# Patient Record
Sex: Female | Born: 1970 | Race: White | Hispanic: No | Marital: Married | Smoking: Never smoker
Health system: Southern US, Community
[De-identification: ages and names within clinical notes are randomized; demographics above are authoritative.]

## PROBLEM LIST (undated history)

## (undated) HISTORY — PX: TUBAL LIGATION: SHX77

---

## 1997-06-29 ENCOUNTER — Inpatient Hospital Stay (HOSPITAL_COMMUNITY): Admission: AD | Admit: 1997-06-29 | Discharge: 1997-07-01 | Payer: Self-pay | Admitting: Obstetrics and Gynecology

## 1997-08-08 ENCOUNTER — Other Ambulatory Visit: Admission: RE | Admit: 1997-08-08 | Discharge: 1997-08-08 | Payer: Self-pay | Admitting: Obstetrics and Gynecology

## 1998-09-04 ENCOUNTER — Other Ambulatory Visit: Admission: RE | Admit: 1998-09-04 | Discharge: 1998-09-04 | Payer: Self-pay | Admitting: Obstetrics and Gynecology

## 1999-04-02 ENCOUNTER — Inpatient Hospital Stay (HOSPITAL_COMMUNITY): Admission: AD | Admit: 1999-04-02 | Discharge: 1999-04-05 | Payer: Self-pay | Admitting: Obstetrics and Gynecology

## 1999-05-11 ENCOUNTER — Other Ambulatory Visit: Admission: RE | Admit: 1999-05-11 | Discharge: 1999-05-11 | Payer: Self-pay | Admitting: Obstetrics and Gynecology

## 2000-05-19 ENCOUNTER — Other Ambulatory Visit: Admission: RE | Admit: 2000-05-19 | Discharge: 2000-05-19 | Payer: Self-pay | Admitting: Obstetrics and Gynecology

## 2001-03-26 ENCOUNTER — Other Ambulatory Visit: Admission: RE | Admit: 2001-03-26 | Discharge: 2001-03-26 | Payer: Self-pay | Admitting: Obstetrics and Gynecology

## 2001-10-11 ENCOUNTER — Encounter: Payer: Self-pay | Admitting: Obstetrics and Gynecology

## 2001-10-11 ENCOUNTER — Inpatient Hospital Stay (HOSPITAL_COMMUNITY): Admission: AD | Admit: 2001-10-11 | Discharge: 2001-10-13 | Payer: Self-pay | Admitting: Obstetrics and Gynecology

## 2001-11-26 ENCOUNTER — Other Ambulatory Visit: Admission: RE | Admit: 2001-11-26 | Discharge: 2001-11-26 | Payer: Self-pay | Admitting: Obstetrics and Gynecology

## 2002-01-29 ENCOUNTER — Encounter: Admission: RE | Admit: 2002-01-29 | Discharge: 2002-01-29 | Payer: Self-pay | Admitting: Pediatrics

## 2002-12-23 ENCOUNTER — Other Ambulatory Visit: Admission: RE | Admit: 2002-12-23 | Discharge: 2002-12-23 | Payer: Self-pay | Admitting: Obstetrics and Gynecology

## 2003-11-27 ENCOUNTER — Other Ambulatory Visit: Admission: RE | Admit: 2003-11-27 | Discharge: 2003-11-27 | Payer: Self-pay | Admitting: Obstetrics and Gynecology

## 2004-06-16 ENCOUNTER — Inpatient Hospital Stay (HOSPITAL_COMMUNITY): Admission: RE | Admit: 2004-06-16 | Discharge: 2004-06-19 | Payer: Self-pay | Admitting: Obstetrics and Gynecology

## 2004-07-28 ENCOUNTER — Other Ambulatory Visit: Admission: RE | Admit: 2004-07-28 | Discharge: 2004-07-28 | Payer: Self-pay | Admitting: Obstetrics and Gynecology

## 2006-02-27 ENCOUNTER — Inpatient Hospital Stay (HOSPITAL_COMMUNITY): Admission: AD | Admit: 2006-02-27 | Discharge: 2006-03-02 | Payer: Self-pay | Admitting: Obstetrics and Gynecology

## 2011-05-09 ENCOUNTER — Other Ambulatory Visit: Payer: Self-pay | Admitting: Otolaryngology

## 2011-05-09 DIAGNOSIS — R599 Enlarged lymph nodes, unspecified: Secondary | ICD-10-CM

## 2011-05-12 ENCOUNTER — Ambulatory Visit
Admission: RE | Admit: 2011-05-12 | Discharge: 2011-05-12 | Disposition: A | Discharge status: Home / Self Care | Payer: 59 | Source: Ambulatory Visit | Attending: Otolaryngology | Admitting: Otolaryngology

## 2011-05-12 DIAGNOSIS — R599 Enlarged lymph nodes, unspecified: Secondary | ICD-10-CM

## 2011-05-12 MED ORDER — IOHEXOL 300 MG/ML  SOLN
75.0000 mL | Freq: Once | INTRAMUSCULAR | Status: AC | PRN
  Administered 2011-05-12: 75 mL via INTRAVENOUS

## 2011-08-20 HISTORY — PX: OTHER SURGICAL HISTORY: SHX169

## 2011-09-06 ENCOUNTER — Other Ambulatory Visit: Payer: Self-pay | Admitting: Otolaryngology

## 2015-08-04 ENCOUNTER — Other Ambulatory Visit: Payer: Self-pay | Admitting: Obstetrics and Gynecology

## 2015-08-04 DIAGNOSIS — R928 Other abnormal and inconclusive findings on diagnostic imaging of breast: Secondary | ICD-10-CM

## 2015-08-10 ENCOUNTER — Ambulatory Visit
Admission: RE | Admit: 2015-08-10 | Discharge: 2015-08-10 | Disposition: A | Discharge status: Home / Self Care | Payer: BLUE CROSS/BLUE SHIELD | Source: Ambulatory Visit | Attending: Obstetrics and Gynecology | Admitting: Obstetrics and Gynecology

## 2015-08-10 DIAGNOSIS — R928 Other abnormal and inconclusive findings on diagnostic imaging of breast: Secondary | ICD-10-CM

## 2018-11-07 DIAGNOSIS — R51 Headache: Secondary | ICD-10-CM | POA: Diagnosis not present

## 2018-11-07 DIAGNOSIS — M546 Pain in thoracic spine: Secondary | ICD-10-CM | POA: Diagnosis not present

## 2018-11-07 DIAGNOSIS — M5013 Cervical disc disorder with radiculopathy, cervicothoracic region: Secondary | ICD-10-CM | POA: Diagnosis not present

## 2018-11-07 DIAGNOSIS — M62830 Muscle spasm of back: Secondary | ICD-10-CM | POA: Diagnosis not present

## 2018-12-05 DIAGNOSIS — M5013 Cervical disc disorder with radiculopathy, cervicothoracic region: Secondary | ICD-10-CM | POA: Diagnosis not present

## 2018-12-05 DIAGNOSIS — M62830 Muscle spasm of back: Secondary | ICD-10-CM | POA: Diagnosis not present

## 2018-12-05 DIAGNOSIS — R51 Headache: Secondary | ICD-10-CM | POA: Diagnosis not present

## 2018-12-05 DIAGNOSIS — M546 Pain in thoracic spine: Secondary | ICD-10-CM | POA: Diagnosis not present

## 2019-01-02 DIAGNOSIS — M5013 Cervical disc disorder with radiculopathy, cervicothoracic region: Secondary | ICD-10-CM | POA: Diagnosis not present

## 2019-01-02 DIAGNOSIS — M62830 Muscle spasm of back: Secondary | ICD-10-CM | POA: Diagnosis not present

## 2019-01-02 DIAGNOSIS — M546 Pain in thoracic spine: Secondary | ICD-10-CM | POA: Diagnosis not present

## 2019-01-30 DIAGNOSIS — M62830 Muscle spasm of back: Secondary | ICD-10-CM | POA: Diagnosis not present

## 2019-01-30 DIAGNOSIS — M546 Pain in thoracic spine: Secondary | ICD-10-CM | POA: Diagnosis not present

## 2019-01-30 DIAGNOSIS — M5013 Cervical disc disorder with radiculopathy, cervicothoracic region: Secondary | ICD-10-CM | POA: Diagnosis not present

## 2019-02-27 DIAGNOSIS — M62830 Muscle spasm of back: Secondary | ICD-10-CM | POA: Diagnosis not present

## 2019-02-27 DIAGNOSIS — M5013 Cervical disc disorder with radiculopathy, cervicothoracic region: Secondary | ICD-10-CM | POA: Diagnosis not present

## 2019-02-27 DIAGNOSIS — M546 Pain in thoracic spine: Secondary | ICD-10-CM | POA: Diagnosis not present

## 2019-03-27 DIAGNOSIS — M5013 Cervical disc disorder with radiculopathy, cervicothoracic region: Secondary | ICD-10-CM | POA: Diagnosis not present

## 2019-03-27 DIAGNOSIS — M546 Pain in thoracic spine: Secondary | ICD-10-CM | POA: Diagnosis not present

## 2019-03-27 DIAGNOSIS — M62830 Muscle spasm of back: Secondary | ICD-10-CM | POA: Diagnosis not present

## 2019-04-24 DIAGNOSIS — M62830 Muscle spasm of back: Secondary | ICD-10-CM | POA: Diagnosis not present

## 2019-04-24 DIAGNOSIS — M546 Pain in thoracic spine: Secondary | ICD-10-CM | POA: Diagnosis not present

## 2019-04-24 DIAGNOSIS — M5013 Cervical disc disorder with radiculopathy, cervicothoracic region: Secondary | ICD-10-CM | POA: Diagnosis not present

## 2019-05-22 DIAGNOSIS — M62830 Muscle spasm of back: Secondary | ICD-10-CM | POA: Diagnosis not present

## 2019-05-22 DIAGNOSIS — M5013 Cervical disc disorder with radiculopathy, cervicothoracic region: Secondary | ICD-10-CM | POA: Diagnosis not present

## 2019-05-22 DIAGNOSIS — M546 Pain in thoracic spine: Secondary | ICD-10-CM | POA: Diagnosis not present

## 2019-06-26 DIAGNOSIS — M5013 Cervical disc disorder with radiculopathy, cervicothoracic region: Secondary | ICD-10-CM | POA: Diagnosis not present

## 2019-06-26 DIAGNOSIS — M62830 Muscle spasm of back: Secondary | ICD-10-CM | POA: Diagnosis not present

## 2019-06-26 DIAGNOSIS — M546 Pain in thoracic spine: Secondary | ICD-10-CM | POA: Diagnosis not present

## 2019-08-01 DIAGNOSIS — M546 Pain in thoracic spine: Secondary | ICD-10-CM | POA: Diagnosis not present

## 2019-08-01 DIAGNOSIS — M5013 Cervical disc disorder with radiculopathy, cervicothoracic region: Secondary | ICD-10-CM | POA: Diagnosis not present

## 2019-08-01 DIAGNOSIS — M62830 Muscle spasm of back: Secondary | ICD-10-CM | POA: Diagnosis not present

## 2019-08-28 DIAGNOSIS — M546 Pain in thoracic spine: Secondary | ICD-10-CM | POA: Diagnosis not present

## 2019-08-28 DIAGNOSIS — M5013 Cervical disc disorder with radiculopathy, cervicothoracic region: Secondary | ICD-10-CM | POA: Diagnosis not present

## 2019-08-28 DIAGNOSIS — M62830 Muscle spasm of back: Secondary | ICD-10-CM | POA: Diagnosis not present

## 2019-11-06 DIAGNOSIS — Z20828 Contact with and (suspected) exposure to other viral communicable diseases: Secondary | ICD-10-CM | POA: Diagnosis not present

## 2019-11-06 DIAGNOSIS — R509 Fever, unspecified: Secondary | ICD-10-CM | POA: Diagnosis not present

## 2019-11-10 DIAGNOSIS — E860 Dehydration: Secondary | ICD-10-CM | POA: Diagnosis not present

## 2019-11-10 DIAGNOSIS — R110 Nausea: Secondary | ICD-10-CM | POA: Diagnosis not present

## 2019-11-10 DIAGNOSIS — U071 COVID-19: Secondary | ICD-10-CM | POA: Diagnosis not present

## 2019-11-14 DIAGNOSIS — R110 Nausea: Secondary | ICD-10-CM | POA: Diagnosis not present

## 2019-11-14 DIAGNOSIS — Z20828 Contact with and (suspected) exposure to other viral communicable diseases: Secondary | ICD-10-CM | POA: Diagnosis not present

## 2019-11-14 DIAGNOSIS — R519 Headache, unspecified: Secondary | ICD-10-CM | POA: Diagnosis not present

## 2019-11-14 DIAGNOSIS — R5383 Other fatigue: Secondary | ICD-10-CM | POA: Diagnosis not present

## 2020-01-14 DIAGNOSIS — H40003 Preglaucoma, unspecified, bilateral: Secondary | ICD-10-CM | POA: Diagnosis not present

## 2020-01-23 ENCOUNTER — Ambulatory Visit: Payer: BLUE CROSS/BLUE SHIELD | Admitting: Family Medicine

## 2020-04-08 ENCOUNTER — Ambulatory Visit: Payer: BLUE CROSS/BLUE SHIELD | Admitting: Family Medicine

## 2020-05-25 NOTE — Progress Notes (Signed)
New Patient Office Visit  Subjective:  Patient ID: Suzanne Costa, female    DOB: 1970/12/05  Age: 50 y.o. MRN: [id number]  CC:  Chief Complaint  Patient presents with  . Gynecologic Exam    HPI [name] presents for History reviewed. No pertinent past medical history.  Encounter for general adult medical examination without abnormal findings  Physical ("At Risk" items are starred): Patient's last physical exam was > 5 years ago .  Smoking: Life-long non-smoker ;  Physical Activity: Exercises at least 3 times per week ;  Alcohol/Drug Use: Is a non-drinker ; No illicit drug use ;  Patient is not afflicted from Stress Incontinence and Urge Incontinence  Safety: reviewed ; Patient wears a seat belt, has smoke detectors, and wears sunscreen with extended sun exposure. No gas in home so no need for carbon monoxide detectors and does not have guns in home.  Dental Care: biannual cleanings, brushes and flosses daily. Ophthalmology/Optometry: Annual visit.  Hearing loss: none Vision impairments: glasses.  Past Surgical History:  Procedure Laterality Date  . CESAREAN SECTION     x 3  . 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 TUBAL LIGATION     with lost c/s    Family History  Problem Relation Age of Onset  . Hyperlipidemia Mother   . Heart attack Father   . CVA Father   . Asthma Father     Social History   Socioeconomic History  . Marital status: Married    Spouse name: Not on file  . Number of children: 4  . Years of education: Not on file  . Highest education level: Not on file  Occupational History  . Not on file  Tobacco Use  . Smoking status: Never Smoker  . Smokeless tobacco: Never Used  Substance and Sexual Activity  . Alcohol use: Never  . Drug use: Never  . Sexual activity: Not on file  Other Topics Concern  . Not on file  Social History Narrative  . Not on file   Social Determinants of Health   Financial Resource Strain: Not on file   Food Insecurity: Not on file  Transportation Needs: Not on file  Physical Activity: Not on file  Stress: Not on file  Social Connections: Not on file  Intimate Partner Violence: Not on file    ROS Review of Systems  Constitutional: Negative for chills, fatigue and fever.  HENT: Negative for congestion, ear pain, rhinorrhea and sore throat.   Respiratory: Negative for cough and shortness of breath.   Cardiovascular: Negative for chest pain.  Gastrointestinal: Negative for abdominal pain, constipation, diarrhea, nausea and vomiting.  Genitourinary: Negative for dysuria and urgency.  Musculoskeletal: Positive for arthralgias. Negative for back pain and myalgias.  Neurological: Negative for dizziness, weakness, light-headedness and headaches.  Psychiatric/Behavioral: Negative for dysphoric mood. The patient is not nervous/anxious.     Objective:   Today's Vitals: BP 118/68   Pulse 84   Temp 98.1 F (36.7 C)   Resp 16   Ht 5\' 3"  (1.6 m)   Wt 175 lb 3.2 oz (79.5 kg)   BMI 31.04 kg/m   Physical Exam Vitals reviewed. Exam conducted with a chaperone present.  Constitutional:      Appearance: Normal appearance. She is normal weight.  HENT:     Right Ear: Tympanic membrane normal.     Left Ear: Tympanic membrane normal.     Nose: Nose normal.  Mouth/Throat:     Pharynx: No oropharyngeal exudate or posterior oropharyngeal erythema.  Eyes:     Conjunctiva/sclera: Conjunctivae normal.  Neck:     Vascular: No carotid bruit.     Comments: No thyromegaly. Cardiovascular:     Rate and Rhythm: Normal rate and regular rhythm.     Pulses: Normal pulses.     Heart sounds: Normal heart sounds.  Pulmonary:     Effort: Pulmonary effort is normal. No respiratory distress.     Breath sounds: Normal breath sounds.  Chest:  Breasts:     Right: Normal. No axillary adenopathy.     Left: Normal. No axillary adenopathy.    Abdominal:     General: Abdomen is flat. Bowel sounds are  normal.     Palpations: Abdomen is soft.     Tenderness: There is no abdominal tenderness.  Genitourinary:    General: Normal vulva.     Labia:        Right: No lesion.        Left: No lesion.      Vagina: No vaginal discharge.     Comments: Interval exam: no uterine enlargement. No ovaries palpated.  Lymphadenopathy:     Cervical: No cervical adenopathy.     Upper Body:     Right upper body: No axillary adenopathy.     Left upper body: No axillary adenopathy.  Skin:    Findings: No lesion or rash.  Neurological:     Mental Status: She is alert and oriented to person, place, and time.  Psychiatric:        Mood and Affect: Mood normal.        Behavior: Behavior normal.     Assessment & Plan:  1. General medical exam Healthy female.  Recommend continue to work on eating healthy diet and exercise. Refuses vaccinations.   2. Visit for screening mammogram - MM Digital Screening  3. Cervical cancer screening - IGP, Aptima HPV, rfx 16/18,45  4. Screen for colon cancer Discussed colon cancer screening. Pt to consider cologuard versus colonoscopy and let us know.    Follow-up: Return for fasting labs.Blane Ohara, MD

## 2020-05-26 ENCOUNTER — Other Ambulatory Visit: Payer: Self-pay

## 2020-05-26 ENCOUNTER — Encounter: Payer: Self-pay | Admitting: Family Medicine

## 2020-05-26 ENCOUNTER — Ambulatory Visit: Payer: BC Managed Care – PPO | Admitting: Family Medicine

## 2020-05-26 VITALS — BP 118/68 | HR 84 | Temp 98.1°F | Resp 16 | Ht 63.0 in | Wt 175.2 lb

## 2020-05-26 DIAGNOSIS — Z0000 Encounter for general adult medical examination without abnormal findings: Secondary | ICD-10-CM

## 2020-05-26 DIAGNOSIS — Z1211 Encounter for screening for malignant neoplasm of colon: Secondary | ICD-10-CM

## 2020-05-26 DIAGNOSIS — Z1231 Encounter for screening mammogram for malignant neoplasm of breast: Secondary | ICD-10-CM

## 2020-05-26 DIAGNOSIS — Z124 Encounter for screening for malignant neoplasm of cervix: Secondary | ICD-10-CM | POA: Diagnosis not present

## 2020-05-26 NOTE — Patient Instructions (Signed)
Preventive Care 84-51 Years Old, Female Preventive care refers to lifestyle choices and visits with your health care provider that can promote health and wellness. This includes:  A yearly physical exam. This is also called an annual wellness visit.  Regular dental and eye exams.  Immunizations.  Screening for certain conditions.  Healthy lifestyle choices, such as: ? Eating a healthy diet. ? Getting regular exercise. ? Not using drugs or products that contain nicotine and tobacco. ? Limiting alcohol use. What can I expect for my preventive care visit? Physical exam Your health care provider will check your:  Height and weight. These may be used to calculate your BMI (body mass index). BMI is a measurement that tells if you are at a healthy weight.  Heart rate and blood pressure.  Body temperature.  Skin for abnormal spots. Counseling Your health care provider may ask you questions about your:  Past medical problems.  Family's medical history.  Alcohol, tobacco, and drug use.  Emotional well-being.  Home life and relationship well-being.  Sexual activity.  Diet, exercise, and sleep habits.  Work and work Statistician.  Access to firearms.  Method of birth control.  Menstrual cycle.  Pregnancy history. What immunizations do I need? Vaccines are usually given at various ages, according to a schedule. Your health care provider will recommend vaccines for you based on your age, medical history, and lifestyle or other factors, such as travel or where you work.   What tests do I need? Blood tests  Lipid and cholesterol levels. These may be checked every 5 years, or more often if you are over 51 years old.  Hepatitis C test.  Hepatitis B test. Screening  Lung cancer screening. You may have this screening every year starting at age 51 if you have a 30-pack-year history of smoking and currently smoke or have quit within the past 15 years.  Colorectal cancer  screening. ? All adults should have this screening starting at age 51 and continuing until age 17. ? Your health care provider may recommend screening at age 49 if you are at increased risk. ? You will have tests every 1-10 years, depending on your results and the type of screening test.  Diabetes screening. ? This is done by checking your blood sugar (glucose) after you have not eaten for a while (fasting). ? You may have this done every 1-3 years.  Mammogram. ? This may be done every 1-2 years. ? Talk with your health care provider about when you should start having regular mammograms. This may depend on whether you have a family history of breast cancer.  BRCA-related cancer screening. This may be done if you have a family history of breast, ovarian, tubal, or peritoneal cancers.  Pelvic exam and Pap test. ? This may be done every 3 years starting at age 51. ? Starting at age 51, this may be done every 5 years if you have a Pap test in combination with an HPV test. Other tests  STD (sexually transmitted disease) testing, if you are at risk.  Bone density scan. This is done to screen for osteoporosis. You may have this scan if you are at high risk for osteoporosis. Talk with your health care provider about your test results, treatment options, and if necessary, the need for more tests. Follow these instructions at home: Eating and drinking  Eat a diet that includes fresh fruits and vegetables, whole grains, lean protein, and low-fat dairy products.  Take vitamin and mineral supplements  as recommended by your health care provider.  Do not drink alcohol if: ? Your health care provider tells you not to drink. ? You are pregnant, may be pregnant, or are planning to become pregnant.  If you drink alcohol: ? Limit how much you have to 0-1 drink a day. ? Be aware of how much alcohol is in your drink. In the [place] one drink equals one 12 oz bottle of beer (355 mL), one 5 oz glass of  wine (148 mL), or one 1 oz glass of hard liquor (44 mL).   Lifestyle  Take daily care of your teeth and gums. Brush your teeth every morning and night with fluoride toothpaste. Floss one time each day.  Stay active. Exercise for at least 30 minutes 5 or more days each week.  Do not use any products that contain nicotine or tobacco, such as cigarettes, e-cigarettes, and chewing tobacco. If you need help quitting, ask your health care provider.  Do not use drugs.  If you are sexually active, practice safe sex. Use a condom or other form of protection to prevent STIs (sexually transmitted infections).  If you do not wish to become pregnant, use a form of birth control. If you plan to become pregnant, see your health care provider for a prepregnancy visit.  If told by your health care provider, take low-dose aspirin daily starting at age 51.  Find healthy ways to cope with stress, such as: ? Meditation, yoga, or listening to music. ? Journaling. ? Talking to a trusted person. ? Spending time with friends and family. Safety  Always wear your seat belt while driving or riding in a vehicle.  Do not drive: ? If you have been drinking alcohol. Do not ride with someone who has been drinking. ? When you are tired or distracted. ? While texting.  Wear a helmet and other protective equipment during sports activities.  If you have firearms in your house, make sure you follow all gun safety procedures. What's next?  Visit your health care provider once a year for an annual wellness visit.  Ask your health care provider how often you should have your eyes and teeth checked.  Stay up to date on all vaccines. This information is not intended to replace advice given to you by your health care provider. Make sure you discuss any questions you have with your health care provider. Document Revised: 12/10/2019 Document Reviewed: 11/16/2017 Elsevier Patient Education  2021 [employer].    https://www.cancer.org/cancer/colon-rectal-cancer/causes-risks-prevention/risk-factors.html">   Colorectal Cancer Screening  Colorectal cancer screening is a group of tests that are used to check for colorectal cancer before symptoms develop. Colorectal refers to the colon and rectum. The colon and rectum are located at the end of the digestive tract and carry stool (feces) out of the body. Who should have screening? All adults who are 65-103 years old should have screening. Your health care provider may recommend screening before age 40. You will have tests every 1-10 years, depending on your results and the type of screening test. Screening recommendations for adults who are 47-102 years old vary depending on a person's health. People older than age 59 should no longer get colorectal cancer screening. You may have screening tests starting before age 63, or more often than other people, if you have any of these risk factors:  A personal or family history of colorectal cancer or abnormal growths known as polyps in your colon.  Inflammatory bowel disease, such as ulcerative colitis  or Crohn's disease.  A history of having radiation treatment to the abdomen or the area between the hip bones (pelvic area) for cancer.  A type of genetic syndrome that is passed from parent to child (hereditary), such as: ? Lynch syndrome. ? Familial adenomatous polyposis. ? Turcot syndrome. ? Peutz-Jeghers syndrome. ? MUTYH-associated polyposis (MAP).  A personal history of diabetes. Types of tests There are several types of colorectal screening tests. You may have one or more of the following:  Guaiac-based fecal occult blood testing. For this test, a stool sample is checked for hidden (occult) blood, which could be a sign of colorectal cancer.  Fecal immunochemical test (FIT). For this test, a stool sample is checked for blood, which could be a sign of colorectal cancer.  Stool DNA test. For this  test, a stool sample is checked for blood and changes in DNA that could lead to colorectal cancer.  Sigmoidoscopy. During this test, a thin, flexible tube with a camera on the end, called a sigmoidoscope, is used to examine the rectum and the lower colon.  Colonoscopy. During this test, a long, flexible tube with a camera on the end, called a colonoscope, is used to examine the entire colon and rectum. Also, sometimes a tissue sample is taken to be looked at under a microscope (biopsy) or small polyps are removed during this test.  Virtual colonoscopy. Instead of a colonoscope, this type of colonoscopy uses a CT scan to take pictures of the colon and rectum. A CT scan is a type of X-ray that is made using computers. What are the benefits of screening? Screening reduces your risk for colorectal cancer and can help identify cancer at an early stage, when the cancer can be removed or treated more easily. It is common for polyps to form in the lining of the colon, especially as you age. These polyps may be cancerous or become cancerous over time. Screening can identify these polyps. What are the risks of screening? Generally, these are safe tests. However, problems may occur, including:  The need for more tests to confirm results from a stool sample test. Stool sample tests have fewer risks than other types of screening tests.  Being exposed to low levels of radiation, if you had a test involving X-rays. This may slightly increase your cancer risk. The benefit of detecting cancer outweighs the slight increase in risk.  Bleeding, damage to the intestine, or infection caused by a sigmoidoscopy or colonoscopy.  A reaction to medicines given during a sigmoidoscopy or colonoscopy. Talk with your health care provider to understand your risk for colorectal cancer and to make a screening plan that is right for you. Questions to ask your health care provider  When should I start colorectal cancer  screening?  What is my risk for colorectal cancer?  How often do I need screening?  Which screening tests do I need?  How do I get my test results?  What do my results mean? Where to find more information Learn more about colorectal cancer screening from:  The American Cancer Society: cancer.[place]ald: cancer.gov Summary  Colorectal cancer screening is a group of tests used to check for colorectal cancer before symptoms develop.  All adults who are 61-1 years old should have screening. Your health care provider may recommend screening before age 36.  You may have screening tests starting before age 34, or more often than other people, if you have certain risk factors.  Screening reduces your  risk for colorectal cancer and can help identify cancer at an early stage, when the cancer can be removed or treated more easily.  Talk with your health care provider to understand your risk for colorectal cancer and to make a screening plan that is right for you. This information is not intended to replace advice given to you by your health care provider. Make sure you discuss any questions you have with your health care provider. Document Revised: 06/26/2019 Document Reviewed: 06/26/2019 Elsevier Patient Education  2021 [employer].

## 2020-05-27 ENCOUNTER — Encounter: Payer: Self-pay | Admitting: Family Medicine

## 2020-05-29 ENCOUNTER — Other Ambulatory Visit: Payer: Self-pay

## 2020-05-29 ENCOUNTER — Other Ambulatory Visit: Payer: BC Managed Care – PPO

## 2020-05-29 DIAGNOSIS — R946 Abnormal results of thyroid function studies: Secondary | ICD-10-CM | POA: Diagnosis not present

## 2020-05-29 DIAGNOSIS — Z0000 Encounter for general adult medical examination without abnormal findings: Secondary | ICD-10-CM | POA: Diagnosis not present

## 2020-05-30 LAB — CBC WITH DIFFERENTIAL/PLATELET
Basophils Absolute: 0 10*3/uL (ref 0.0–0.2)
Basos: 1 %
EOS (ABSOLUTE): 0.2 10*3/uL (ref 0.0–0.4)
Eos: 3 %
Hematocrit: 34.2 % (ref 34.0–46.6)
Hemoglobin: 10.8 g/dL — ABNORMAL LOW (ref 11.1–15.9)
Immature Grans (Abs): 0 10*3/uL (ref 0.0–0.1)
Immature Granulocytes: 0 %
Lymphocytes Absolute: 1.8 10*3/uL (ref 0.7–3.1)
Lymphs: 30 %
MCH: 24.1 pg — ABNORMAL LOW (ref 26.6–33.0)
MCHC: 31.6 g/dL (ref 31.5–35.7)
MCV: 76 fL — ABNORMAL LOW (ref 79–97)
Monocytes Absolute: 0.6 10*3/uL (ref 0.1–0.9)
Monocytes: 10 %
Neutrophils Absolute: 3.5 10*3/uL (ref 1.4–7.0)
Neutrophils: 56 %
Platelets: 329 10*3/uL (ref 150–450)
RBC: 4.49 x10E6/uL (ref 3.77–5.28)
RDW: 14.1 % (ref 11.7–15.4)
WBC: 6.1 10*3/uL (ref 3.4–10.8)

## 2020-05-30 LAB — COMPREHENSIVE METABOLIC PANEL
ALT: 11 IU/L (ref 0–32)
AST: 11 IU/L (ref 0–40)
Albumin/Globulin Ratio: 1.9 (ref 1.2–2.2)
Albumin: 4.1 g/dL (ref 3.8–4.8)
Alkaline Phosphatase: 57 IU/L (ref 44–121)
BUN/Creatinine Ratio: 11 (ref 9–23)
BUN: 10 mg/dL (ref 6–24)
Bilirubin Total: 0.4 mg/dL (ref 0.0–1.2)
CO2: 20 mmol/L (ref 20–29)
Calcium: 9 mg/dL (ref 8.7–10.2)
Chloride: 103 mmol/L (ref 96–106)
Creatinine, Ser: 0.92 mg/dL (ref 0.57–1.00)
Globulin, Total: 2.2 g/dL (ref 1.5–4.5)
Glucose: 92 mg/dL (ref 65–99)
Potassium: 4.5 mmol/L (ref 3.5–5.2)
Sodium: 137 mmol/L (ref 134–144)
Total Protein: 6.3 g/dL (ref 6.0–8.5)
eGFR: 76 mL/min/{1.73_m2} (ref >59)

## 2020-05-30 LAB — LIPID PANEL
Chol/HDL Ratio: 4.3 ratio (ref 0.0–4.4)
Cholesterol, Total: 192 mg/dL (ref 100–199)
HDL: 45 mg/dL (ref >39)
LDL Chol Calc (NIH): 129 mg/dL — ABNORMAL HIGH (ref 0–99)
Triglycerides: 101 mg/dL (ref 0–149)
VLDL Cholesterol Cal: 18 mg/dL (ref 5–40)

## 2020-05-30 LAB — CARDIOVASCULAR RISK ASSESSMENT

## 2020-05-30 LAB — TSH: TSH: 4.62 u[IU]/mL — ABNORMAL HIGH (ref 0.450–4.500)

## 2020-06-02 LAB — SPECIMEN STATUS REPORT

## 2020-06-02 LAB — T4, FREE: Free T4: 1.01 ng/dL (ref 0.82–1.77)

## 2020-06-03 LAB — IGP, APTIMA HPV, RFX 16/18,45
HPV Aptima: NEGATIVE
PAP Smear Comment: 0

## 2020-06-04 ENCOUNTER — Other Ambulatory Visit: Payer: Self-pay

## 2020-06-04 DIAGNOSIS — D508 Other iron deficiency anemias: Secondary | ICD-10-CM

## 2020-06-04 DIAGNOSIS — Z1211 Encounter for screening for malignant neoplasm of colon: Secondary | ICD-10-CM

## 2020-06-23 ENCOUNTER — Telehealth: Payer: Self-pay

## 2020-06-23 NOTE — Telephone Encounter (Signed)
Patient scheduled and aware of Mammogram at [facility] [id number]   08/12/2020 at 1:50 p.m.

## 2020-07-08 ENCOUNTER — Other Ambulatory Visit: Payer: BC Managed Care – PPO

## 2020-07-15 ENCOUNTER — Other Ambulatory Visit: Payer: BC Managed Care – PPO

## 2020-07-15 DIAGNOSIS — D508 Other iron deficiency anemias: Secondary | ICD-10-CM | POA: Diagnosis not present

## 2020-07-16 LAB — CBC WITH DIFFERENTIAL/PLATELET
Basophils Absolute: 0 10*3/uL (ref 0.0–0.2)
Basos: 1 %
EOS (ABSOLUTE): 0.2 10*3/uL (ref 0.0–0.4)
Eos: 2 %
Hematocrit: 35.6 % (ref 34.0–46.6)
Hemoglobin: 11.4 g/dL (ref 11.1–15.9)
Immature Grans (Abs): 0 10*3/uL (ref 0.0–0.1)
Immature Granulocytes: 0 %
Lymphocytes Absolute: 2.4 10*3/uL (ref 0.7–3.1)
Lymphs: 29 %
MCH: 25.1 pg — ABNORMAL LOW (ref 26.6–33.0)
MCHC: 32 g/dL (ref 31.5–35.7)
MCV: 78 fL — ABNORMAL LOW (ref 79–97)
Monocytes Absolute: 0.7 10*3/uL (ref 0.1–0.9)
Monocytes: 8 %
Neutrophils Absolute: 4.8 10*3/uL (ref 1.4–7.0)
Neutrophils: 60 %
Platelets: 311 10*3/uL (ref 150–450)
RBC: 4.55 x10E6/uL (ref 3.77–5.28)
RDW: 16.6 % — ABNORMAL HIGH (ref 11.7–15.4)
WBC: 8.2 10*3/uL (ref 3.4–10.8)

## 2020-08-08 ENCOUNTER — Other Ambulatory Visit: Payer: Self-pay

## 2020-08-08 ENCOUNTER — Ambulatory Visit
Admission: RE | Admit: 2020-08-08 | Discharge: 2020-08-08 | Disposition: A | Discharge status: Home / Self Care | Payer: BC Managed Care – PPO | Source: Ambulatory Visit | Attending: Family Medicine | Admitting: Family Medicine

## 2020-08-08 DIAGNOSIS — Z1231 Encounter for screening mammogram for malignant neoplasm of breast: Secondary | ICD-10-CM | POA: Diagnosis not present

## 2020-08-12 ENCOUNTER — Ambulatory Visit: Payer: BC Managed Care – PPO

## 2020-09-29 DIAGNOSIS — Z1212 Encounter for screening for malignant neoplasm of rectum: Secondary | ICD-10-CM | POA: Diagnosis not present

## 2020-09-29 DIAGNOSIS — D649 Anemia, unspecified: Secondary | ICD-10-CM | POA: Diagnosis not present

## 2020-10-23 DIAGNOSIS — D649 Anemia, unspecified: Secondary | ICD-10-CM | POA: Diagnosis not present

## 2020-11-18 DIAGNOSIS — M62830 Muscle spasm of back: Secondary | ICD-10-CM | POA: Diagnosis not present

## 2020-11-18 DIAGNOSIS — M5013 Cervical disc disorder with radiculopathy, cervicothoracic region: Secondary | ICD-10-CM | POA: Diagnosis not present

## 2020-11-18 DIAGNOSIS — M546 Pain in thoracic spine: Secondary | ICD-10-CM | POA: Diagnosis not present

## 2020-12-23 DIAGNOSIS — M5013 Cervical disc disorder with radiculopathy, cervicothoracic region: Secondary | ICD-10-CM | POA: Diagnosis not present

## 2020-12-23 DIAGNOSIS — M546 Pain in thoracic spine: Secondary | ICD-10-CM | POA: Diagnosis not present

## 2020-12-23 DIAGNOSIS — M62830 Muscle spasm of back: Secondary | ICD-10-CM | POA: Diagnosis not present

## 2021-01-20 DIAGNOSIS — M62830 Muscle spasm of back: Secondary | ICD-10-CM | POA: Diagnosis not present

## 2021-01-20 DIAGNOSIS — M5013 Cervical disc disorder with radiculopathy, cervicothoracic region: Secondary | ICD-10-CM | POA: Diagnosis not present

## 2021-01-20 DIAGNOSIS — M546 Pain in thoracic spine: Secondary | ICD-10-CM | POA: Diagnosis not present

## 2021-02-09 DIAGNOSIS — M5013 Cervical disc disorder with radiculopathy, cervicothoracic region: Secondary | ICD-10-CM | POA: Diagnosis not present

## 2021-02-09 DIAGNOSIS — M546 Pain in thoracic spine: Secondary | ICD-10-CM | POA: Diagnosis not present

## 2021-02-09 DIAGNOSIS — M62830 Muscle spasm of back: Secondary | ICD-10-CM | POA: Diagnosis not present

## 2021-03-10 DIAGNOSIS — M62830 Muscle spasm of back: Secondary | ICD-10-CM | POA: Diagnosis not present

## 2021-03-10 DIAGNOSIS — M546 Pain in thoracic spine: Secondary | ICD-10-CM | POA: Diagnosis not present

## 2021-03-10 DIAGNOSIS — M5013 Cervical disc disorder with radiculopathy, cervicothoracic region: Secondary | ICD-10-CM | POA: Diagnosis not present

## 2021-04-14 DIAGNOSIS — M546 Pain in thoracic spine: Secondary | ICD-10-CM | POA: Diagnosis not present

## 2021-04-14 DIAGNOSIS — M62830 Muscle spasm of back: Secondary | ICD-10-CM | POA: Diagnosis not present

## 2021-04-14 DIAGNOSIS — M5013 Cervical disc disorder with radiculopathy, cervicothoracic region: Secondary | ICD-10-CM | POA: Diagnosis not present

## 2021-06-16 DIAGNOSIS — M5013 Cervical disc disorder with radiculopathy, cervicothoracic region: Secondary | ICD-10-CM | POA: Diagnosis not present

## 2021-06-16 DIAGNOSIS — M546 Pain in thoracic spine: Secondary | ICD-10-CM | POA: Diagnosis not present

## 2021-06-16 DIAGNOSIS — M62830 Muscle spasm of back: Secondary | ICD-10-CM | POA: Diagnosis not present

## 2021-07-16 DIAGNOSIS — M5013 Cervical disc disorder with radiculopathy, cervicothoracic region: Secondary | ICD-10-CM | POA: Diagnosis not present

## 2021-07-16 DIAGNOSIS — M62830 Muscle spasm of back: Secondary | ICD-10-CM | POA: Diagnosis not present

## 2021-07-16 DIAGNOSIS — M546 Pain in thoracic spine: Secondary | ICD-10-CM | POA: Diagnosis not present

## 2021-08-07 IMAGING — MG MM DIGITAL SCREENING BILAT W/ TOMO AND CAD
8 series · 8 of 24 positions shown · non-contrast
Comparison: Previous exam(s).

CLINICAL DATA: Screening.

EXAM:
DIGITAL SCREENING BILATERAL MAMMOGRAM WITH TOMOSYNTHESIS AND CAD
TECHNIQUE: Bilateral screening digital craniocaudal and mediolateral oblique
mammograms were obtained. Bilateral screening digital breast
tomosynthesis was performed. The images were evaluated with
computer-aided detection.

[L CC synth-2D]
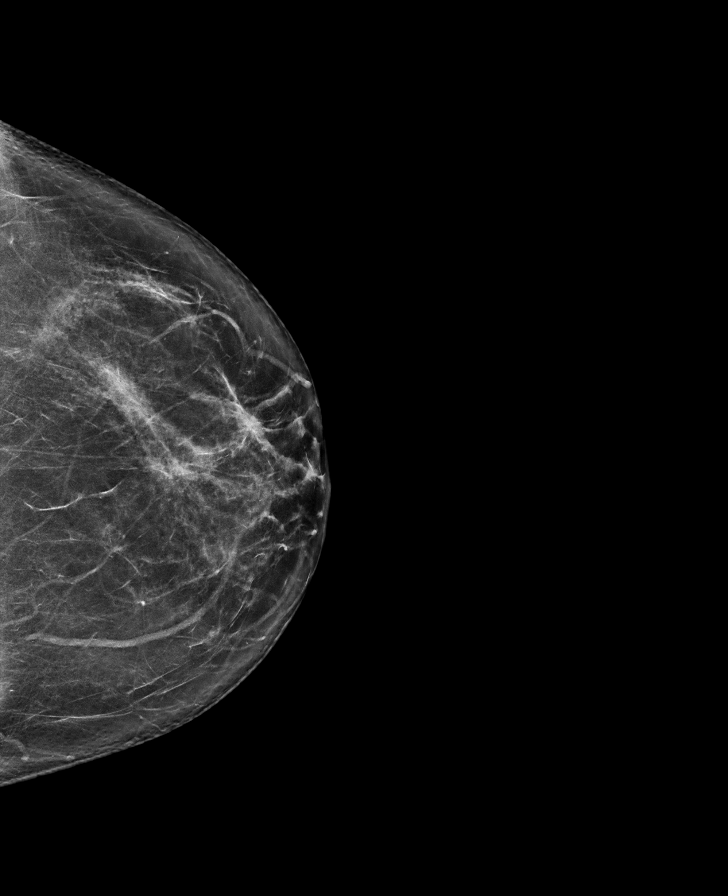

[R MLO synth-2D]
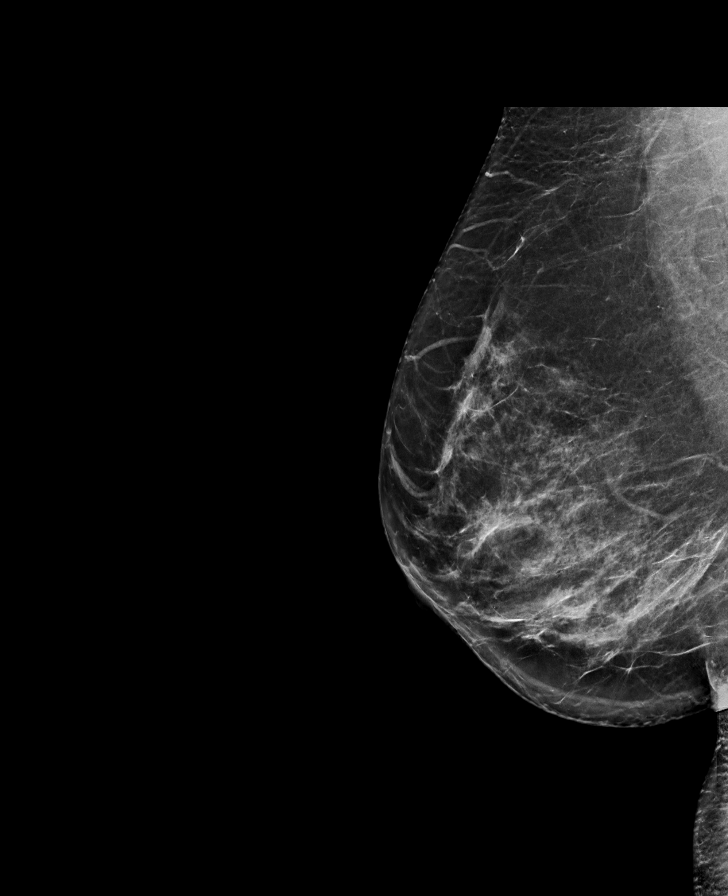

[L MLO synth-2D]
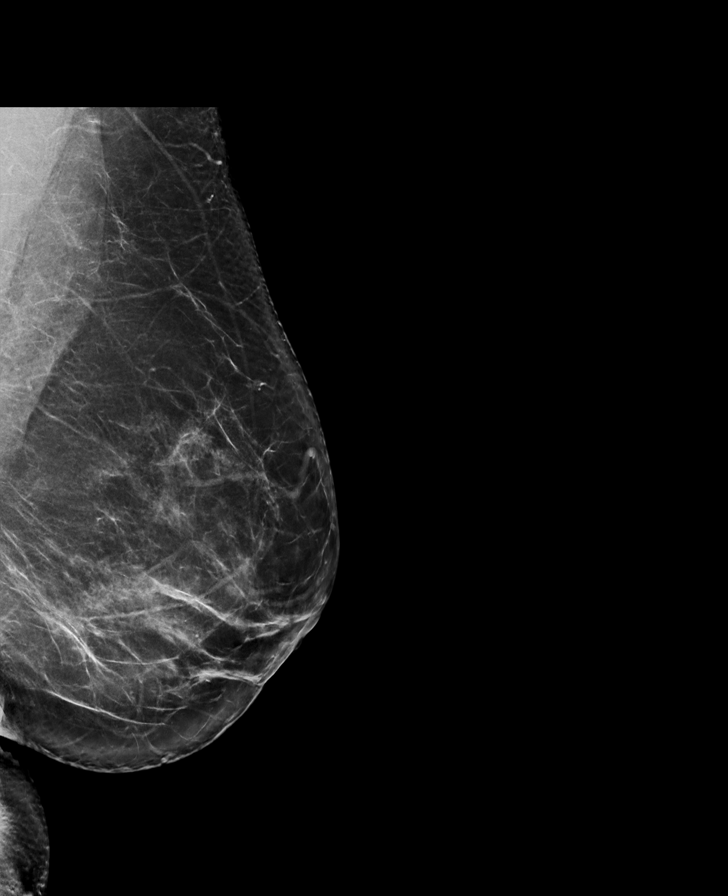

[R CC synth-2D]
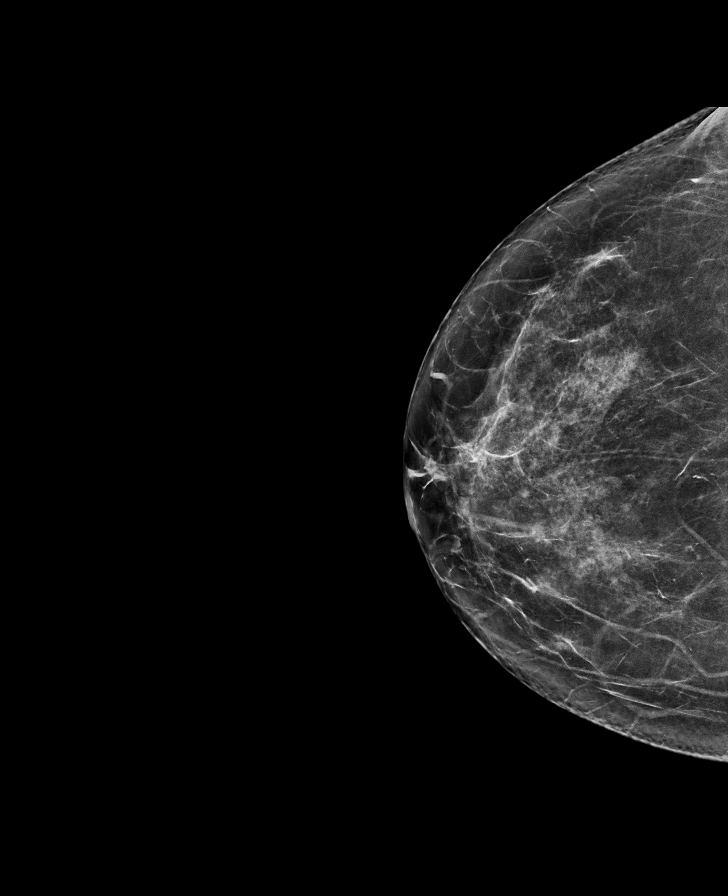

[R MLO tomo · tomo slice 41/81.0]
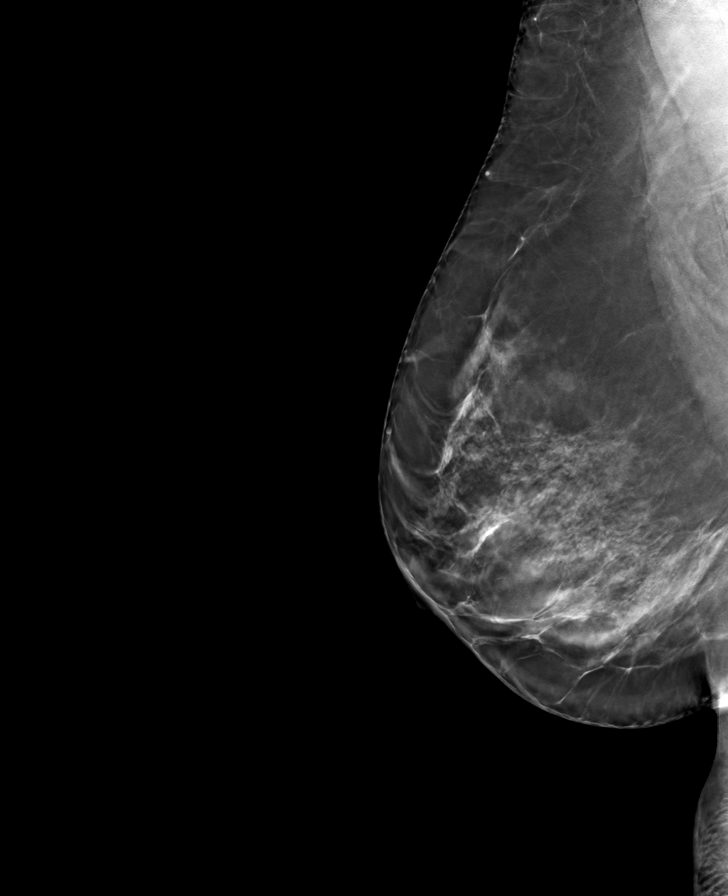

[L CC tomo · tomo slice 39/76.0]
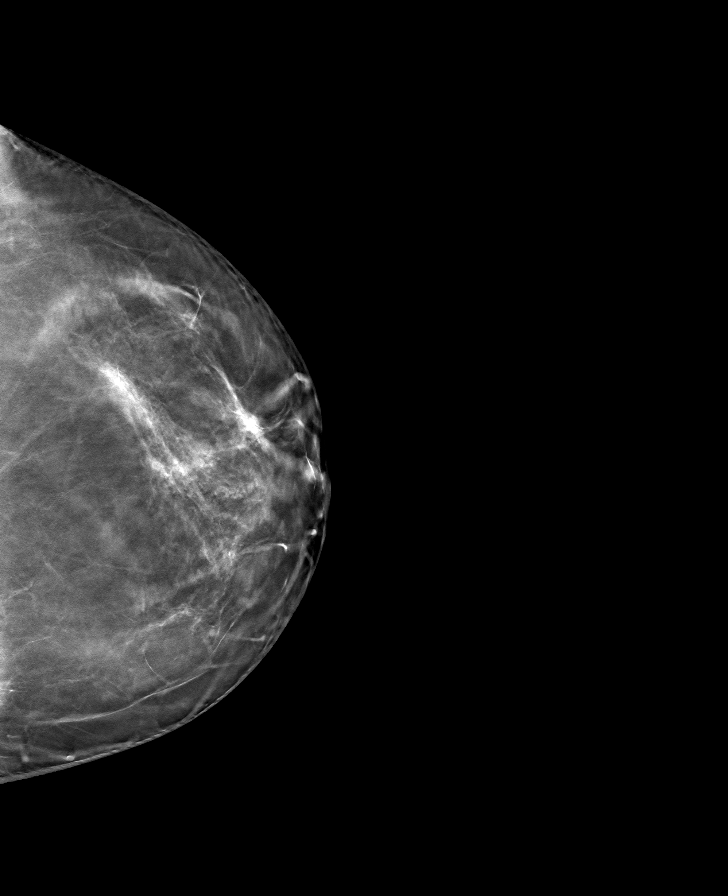

[L MLO tomo · tomo slice 43/86.0]
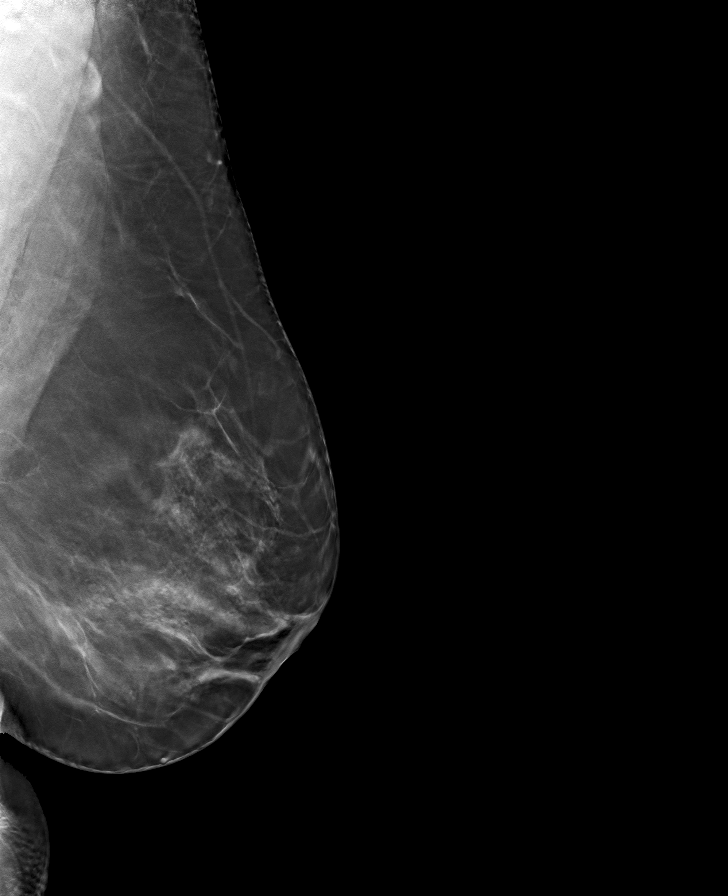

[R CC tomo · tomo slice 38/75.0]
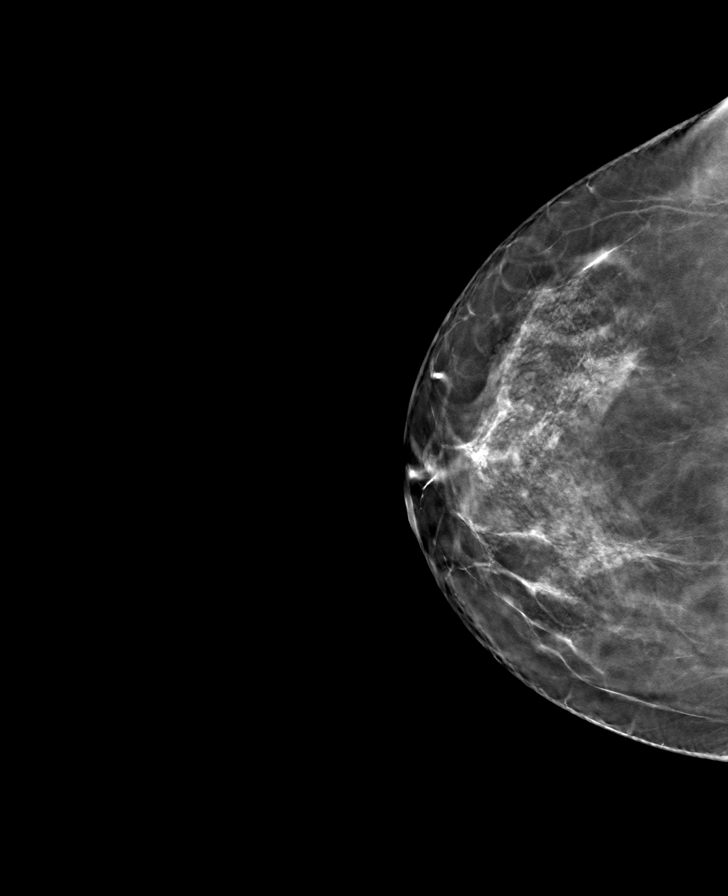

[8 of 24 positions shown; findings below may reference images not displayed]

ACR Breast Density Category b: There are scattered areas of
fibroglandular density.
FINDINGS: There are no findings suspicious for malignancy. The images were
evaluated with computer-aided detection.
IMPRESSION: No mammographic evidence of malignancy. A result letter of this
screening mammogram will be mailed directly to the patient.

RECOMMENDATION:
Screening mammogram in one year. (Code:WJ-[id number])

BI-RADS CATEGORY  1: Negative.

## 2021-08-20 DIAGNOSIS — M5013 Cervical disc disorder with radiculopathy, cervicothoracic region: Secondary | ICD-10-CM | POA: Diagnosis not present

## 2021-08-20 DIAGNOSIS — M62830 Muscle spasm of back: Secondary | ICD-10-CM | POA: Diagnosis not present

## 2021-08-20 DIAGNOSIS — M546 Pain in thoracic spine: Secondary | ICD-10-CM | POA: Diagnosis not present

## 2021-09-24 DIAGNOSIS — M5013 Cervical disc disorder with radiculopathy, cervicothoracic region: Secondary | ICD-10-CM | POA: Diagnosis not present

## 2021-09-24 DIAGNOSIS — M546 Pain in thoracic spine: Secondary | ICD-10-CM | POA: Diagnosis not present

## 2021-09-24 DIAGNOSIS — M62830 Muscle spasm of back: Secondary | ICD-10-CM | POA: Diagnosis not present
